# Patient Record
Sex: Female | Born: 2000 | Race: Black or African American | Hispanic: No | Marital: Single | Smoking: Current every day smoker
Health system: Southern US, Community
[De-identification: ages and names within clinical notes are randomized; demographics above are authoritative.]

## PROBLEM LIST (undated history)

## (undated) DIAGNOSIS — F319 Bipolar disorder, unspecified: Secondary | ICD-10-CM

## (undated) DIAGNOSIS — E119 Type 2 diabetes mellitus without complications: Secondary | ICD-10-CM

## (undated) DIAGNOSIS — R011 Cardiac murmur, unspecified: Secondary | ICD-10-CM

## (undated) HISTORY — PX: CATARACT EXTRACTION W/ INTRAOCULAR LENS IMPLANT: SHX1309

## (undated) HISTORY — PX: CARDIAC SURGERY: SHX584

---
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
## 2020-02-26 ENCOUNTER — Encounter (HOSPITAL_COMMUNITY): Payer: Self-pay | Admitting: Emergency Medicine

## 2020-02-26 ENCOUNTER — Other Ambulatory Visit: Payer: Self-pay

## 2020-02-26 ENCOUNTER — Emergency Department (HOSPITAL_COMMUNITY)
Admission: EM | Admit: 2020-02-26 | Discharge: 2020-02-26 | Disposition: A | Discharge status: Home / Self Care | Payer: Medicaid Other | Attending: Emergency Medicine | Admitting: Emergency Medicine

## 2020-02-26 DIAGNOSIS — S0992XA Unspecified injury of nose, initial encounter: Secondary | ICD-10-CM | POA: Diagnosis present

## 2020-02-26 DIAGNOSIS — S0511XA Contusion of eyeball and orbital tissues, right eye, initial encounter: Secondary | ICD-10-CM | POA: Diagnosis not present

## 2020-02-26 DIAGNOSIS — S0512XA Contusion of eyeball and orbital tissues, left eye, initial encounter: Secondary | ICD-10-CM | POA: Insufficient documentation

## 2020-02-26 DIAGNOSIS — Z794 Long term (current) use of insulin: Secondary | ICD-10-CM | POA: Insufficient documentation

## 2020-02-26 DIAGNOSIS — E119 Type 2 diabetes mellitus without complications: Secondary | ICD-10-CM | POA: Diagnosis not present

## 2020-02-26 DIAGNOSIS — W208XXA Other cause of strike by thrown, projected or falling object, initial encounter: Secondary | ICD-10-CM | POA: Diagnosis not present
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
## 2020-02-26 NOTE — Discharge Instructions (Addendum)
As we discussed you likely have a fractured nasal bone. Without imaging however we cannot completely confirm this.  It is reasonable that you declined imaging at this time.  What I recommend is following up with [facility]NT.  Please call Monday to make an appointment.  In the meantime use Tylenol and ibuprofen as discussed below.  Cool compresses can be helpful to decrease in swelling and elevating the head of bed and avoid forceful blowing of nose.  Please use Tylenol or ibuprofen for pain.  You may use 600 mg ibuprofen every 6 hours or 1000 mg of Tylenol every 6 hours.  You may choose to alternate between the 2.  This would be most effective.  Not to exceed 4 g of Tylenol within 24 hours.  Not to exceed 3200 mg ibuprofen 24 hours.
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
## 2020-11-11 ENCOUNTER — Emergency Department (HOSPITAL_COMMUNITY): Payer: Medicaid Other

## 2020-11-11 ENCOUNTER — Other Ambulatory Visit: Payer: Self-pay

## 2020-11-11 ENCOUNTER — Emergency Department (HOSPITAL_COMMUNITY)
Admission: EM | Admit: 2020-11-11 | Discharge: 2020-11-11 | Disposition: A | Discharge status: Home / Self Care | Payer: Medicaid Other | Attending: Emergency Medicine | Admitting: Emergency Medicine

## 2020-11-11 ENCOUNTER — Encounter (HOSPITAL_COMMUNITY): Payer: Self-pay

## 2020-11-11 DIAGNOSIS — R102 Pelvic and perineal pain: Secondary | ICD-10-CM | POA: Diagnosis not present

## 2020-11-11 DIAGNOSIS — E109 Type 1 diabetes mellitus without complications: Secondary | ICD-10-CM | POA: Insufficient documentation

## 2020-11-11 DIAGNOSIS — F17290 Nicotine dependence, other tobacco product, uncomplicated: Secondary | ICD-10-CM | POA: Diagnosis not present

## 2020-11-11 DIAGNOSIS — R1084 Generalized abdominal pain: Secondary | ICD-10-CM | POA: Diagnosis not present

## 2020-11-11 DIAGNOSIS — R112 Nausea with vomiting, unspecified: Secondary | ICD-10-CM | POA: Insufficient documentation

## 2020-11-11 DIAGNOSIS — D72829 Elevated white blood cell count, unspecified: Secondary | ICD-10-CM | POA: Insufficient documentation

## 2020-11-11 DIAGNOSIS — Z794 Long term (current) use of insulin: Secondary | ICD-10-CM | POA: Diagnosis not present

## 2020-11-11 LAB — CBC WITH DIFFERENTIAL/PLATELET
Abs Immature Granulocytes: 0.09 10*3/uL — ABNORMAL HIGH (ref 0.00–0.07)
Basophils Absolute: 0 10*3/uL (ref 0.0–0.1)
Basophils Relative: 0 %
Eosinophils Absolute: 0 10*3/uL (ref 0.0–0.5)
Eosinophils Relative: 0 %
HCT: 46.3 % — ABNORMAL HIGH (ref 36.0–46.0)
Hemoglobin: 14.9 g/dL (ref 12.0–15.0)
Immature Granulocytes: 1 %
Lymphocytes Relative: 14 %
Lymphs Abs: 2.1 10*3/uL (ref 0.7–4.0)
MCH: 28.4 pg (ref 26.0–34.0)
MCHC: 32.2 g/dL (ref 30.0–36.0)
MCV: 88.2 fL (ref 80.0–100.0)
Monocytes Absolute: 0.5 10*3/uL (ref 0.1–1.0)
Monocytes Relative: 4 %
Neutro Abs: 11.8 10*3/uL — ABNORMAL HIGH (ref 1.7–7.7)
Neutrophils Relative %: 81 %
Platelets: 255 10*3/uL (ref 150–400)
RBC: 5.25 MIL/uL — ABNORMAL HIGH (ref 3.87–5.11)
RDW: 14 % (ref 11.5–15.5)
WBC: 14.5 10*3/uL — ABNORMAL HIGH (ref 4.0–10.5)
nRBC: 0 % (ref 0.0–0.2)

## 2020-11-11 LAB — COMPREHENSIVE METABOLIC PANEL
ALT: 16 U/L (ref 0–44)
AST: 15 U/L (ref 15–41)
Albumin: 4.3 g/dL (ref 3.5–5.0)
Alkaline Phosphatase: 66 U/L (ref 38–126)
Anion gap: 11 (ref 5–15)
BUN: 11 mg/dL (ref 6–20)
CO2: 25 mmol/L (ref 22–32)
Calcium: 9.5 mg/dL (ref 8.9–10.3)
Chloride: 103 mmol/L (ref 98–111)
Creatinine, Ser: 0.59 mg/dL (ref 0.44–1.00)
GFR, Estimated: >60 mL/min (ref >60)
Glucose, Bld: 235 mg/dL — ABNORMAL HIGH (ref 70–99)
Potassium: 3.6 mmol/L (ref 3.5–5.1)
Sodium: 139 mmol/L (ref 135–145)
Total Bilirubin: 0.8 mg/dL (ref 0.3–1.2)
Total Protein: 7.5 g/dL (ref 6.5–8.1)

## 2020-11-11 LAB — WET PREP, GENITAL
Clue Cells Wet Prep HPF POC: NONE SEEN
Sperm: NONE SEEN
Trich, Wet Prep: NONE SEEN
WBC, Wet Prep HPF POC: <10 — AB
Yeast Wet Prep HPF POC: NONE SEEN

## 2020-11-11 LAB — LIPASE, BLOOD: Lipase: 28 U/L (ref 11–51)

## 2020-11-11 LAB — URINALYSIS, ROUTINE W REFLEX MICROSCOPIC
Bacteria, UA: NONE SEEN
Bilirubin Urine: NEGATIVE
Glucose, UA: 150 mg/dL — AB
Ketones, ur: 80 mg/dL — AB
Nitrite: NEGATIVE
Protein, ur: 100 mg/dL — AB
RBC / HPF: >50 RBC/hpf — ABNORMAL HIGH (ref 0–5)
Specific Gravity, Urine: 1.043 — ABNORMAL HIGH (ref 1.005–1.030)
pH: 6 (ref 5.0–8.0)

## 2020-11-11 LAB — I-STAT BETA HCG BLOOD, ED (MC, WL, AP ONLY): I-stat hCG, quantitative: <5 m[IU]/mL (ref <5)

## 2020-11-11 LAB — CBG MONITORING, ED: Glucose-Capillary: 232 mg/dL — ABNORMAL HIGH (ref 70–99)

## 2020-11-11 MED ORDER — IBUPROFEN 800 MG PO TABS
800.0000 mg | ORAL_TABLET | Freq: Once | ORAL | Status: AC
  Administered 2020-11-11: 800 mg via ORAL
  Filled 2020-11-11: qty 1

## 2020-11-11 MED ORDER — ONDANSETRON 4 MG PO TBDP: 4.0000 mg | ORAL_TABLET | Freq: Three times a day (TID) | ORAL | 0 refills | Status: AC | PRN

## 2020-11-11 MED ORDER — SODIUM CHLORIDE 0.9 % IV BOLUS
1000.0000 mL | Freq: Once | INTRAVENOUS | Status: AC
  Administered 2020-11-11: 1000 mL via INTRAVENOUS

## 2020-11-11 MED ORDER — MORPHINE SULFATE (PF) 4 MG/ML IV SOLN
4.0000 mg | Freq: Once | INTRAVENOUS | Status: AC
Start: 2020-11-11 — End: 2020-11-11
  Administered 2020-11-11: 4 mg via INTRAVENOUS
  Filled 2020-11-11: qty 1

## 2020-11-11 MED ORDER — ONDANSETRON HCL 4 MG/2ML IJ SOLN
4.0000 mg | Freq: Once | INTRAMUSCULAR | Status: AC
  Administered 2020-11-11: 4 mg via INTRAVENOUS
  Filled 2020-11-11: qty 2

## 2020-11-11 MED ORDER — IOHEXOL 300 MG/ML  SOLN
100.0000 mL | Freq: Once | INTRAMUSCULAR | Status: AC | PRN
  Administered 2020-11-11: 100 mL via INTRAVENOUS

## 2020-11-11 NOTE — Discharge Instructions (Addendum)
You were seen in the emergency department today for abdominal pain, nausea and vomiting.  While you were here we did an extensive work-up which was reassuring that there is nothing emergent going on in your abdomen.  I am going to send you home with nausea medication to take as needed over the next few days.  I would like you to follow-up with your primary care provider in 3 days if you are continuing to have any symptoms.  Please return to the emergency department if you have significantly increased abdominal pain with nausea and vomiting despite using your medicines and are unable to take any food or water down, or begin to have fevers not responsive to Tylenol.

## 2020-11-11 NOTE — ED Triage Notes (Signed)
Pt presents to ED with complaints of emesis since last night at 1900. Pt is type 1 diabetic. Pt takes Tresiba 28 units in am and Novolog carb SS, missed dose of Tresiba this am. CBG with EMS 287.

## 2020-11-11 NOTE — ED Notes (Signed)
Pelvic supplies at bedside. 

## 2020-11-11 NOTE — ED Notes (Signed)
Assisted PA with pelvic exam. Pt tolerated well.

## 2020-11-11 NOTE — ED Notes (Signed)
Pt sleeping on side causing bp to be soft. Normotensive on back. Reports going to restroom and noted approx 3 small clots come out of her vagina and states they were the size of a fingernail. PA updated and aware. Pt also reports 5/10 pain in in lower central abdomen. New orders obtained for pain controll.

## 2020-11-11 NOTE — ED Notes (Signed)
Pt states that zofran has helped her nausea, but still mildly present. Morphine given for 10/10 abdominal pain

## 2020-11-11 NOTE — ED Provider Notes (Signed)
[facility] EMERGENCY DEPARTMENT Provider Note   CSN: [id number] Arrival date & time: 11/11/20  1024     History Chief Complaint  Patient presents with   Emesis    Bailey Robinson is a 20 y.o. female.  With past medical history of type 1 diabetes who presents to the emergency department with nausea vomiting and abdominal pain.  She describes 5/10 intermittent, cramping lower abdominal pain that began suddenly yesterday and has progressed to today.  She states she took a nap yesterday and woke up around 5 PM and noticed that she had vaginal bleeding.  Describes it as spotting, but requiring a pad.  She states her last menstrual period was 10/1-10/5.  Denies other vaginal discharge or urinary symptoms.  She also states since yesterday evening she has had nausea and vomiting.  States that abdominal pain improves after vomiting and then gradually recurs.  She states she has vomited a total of around 6 times.  She states that she has not been able to keep any food or water down since yesterday.  She has not taken her insulin this morning.  She uses [place] 16 units, and NovoLog based on carb intake.  Denies fevers, cough, diarrhea, chest pain, shortness of breath.   Emesis Associated symptoms: abdominal pain   Associated symptoms: no fever       Past Medical History:  Diagnosis Date   Bipolar 1 disorder (HCC)    Diabetes mellitus without complication (HCC)    Heart murmur     There are no problems to display for this patient.   Past Surgical History:  Procedure Laterality Date   CARDIAC SURGERY     CATARACT EXTRACTION W/ INTRAOCULAR LENS IMPLANT Left      OB History   No obstetric history on file.     No family history on file.  Social History   Tobacco Use   Smoking status: Every Day    Types: Cigars   Smokeless tobacco: Never  Substance Use Topics   Alcohol use: Yes    Comment: occ   Drug use: Not Currently    Home Medications Prior to Admission  medications   Medication Sig Start Date End Date Taking? Authorizing Provider  glucagon (GLUCAGON EMERGENCY) 1 MG injection Inject 1 mg into the vein once as needed (severe hypoglycemia).    [provider]  insulin aspart (NOVOLOG) 100 UNIT/ML injection Inject 70 Units into the skin See admin instructions. Inject 10u per 8gms at breakfast, 1u per 15gms at lunch, 1u per 10gms at dinner and 1u per 15gms with snacks - max 70u daily    [provider]  insulin degludec (TRESIBA FLEXTOUCH) 100 UNIT/ML SOPN FlexTouch Pen Inject 16 Units into the skin daily.     [provider]    Allergies    Patient has no known allergies.  Review of Systems   Review of Systems  Constitutional:  Negative for fever.  Respiratory:  Negative for shortness of breath.   Cardiovascular:  Negative for chest pain.  Gastrointestinal:  Positive for abdominal pain, nausea and vomiting.  Genitourinary:  Positive for pelvic pain and vaginal bleeding. Negative for dysuria.  All other systems reviewed and are negative.  Physical Exam Updated Vital Signs Ht  (1.499 m)   Wt 61.2 kg   LMP 10/14/2020   BMI 27.27 kg/m   Physical Exam Vitals and nursing note reviewed. Exam conducted with a chaperone present.  Constitutional:      General:  She is not in acute distress.    Appearance: Normal appearance. She is not toxic-appearing.  HENT:     Head: Normocephalic and atraumatic.     Nose: Nose normal.     Mouth/Throat:     Mouth: Mucous membranes are moist.     Pharynx: Oropharynx is clear.  Eyes:     General: No scleral icterus.    Pupils: Pupils are equal, round, and reactive to light.  Cardiovascular:     Rate and Rhythm: Normal rate and regular rhythm.     Pulses: Normal pulses.  Pulmonary:     Effort: Pulmonary effort is normal. No respiratory distress.     Breath sounds: Normal breath sounds.  Abdominal:     General: Abdomen is flat. Bowel sounds are normal. There is no  distension.     Palpations: Abdomen is soft.     Tenderness: There is abdominal tenderness in the epigastric area and suprapubic area. There is no right CVA tenderness, left CVA tenderness, guarding or rebound. Negative signs include Murphy's sign and McBurney's sign.  Genitourinary:    General: Normal vulva.     Exam position: Lithotomy position.     Vagina: No signs of injury. Bleeding present. No vaginal discharge or lesions.     Cervix: Cervical motion tenderness and cervical bleeding present. No discharge, lesion or erythema.     Adnexa:        Right: No tenderness.         Left: No tenderness.    Musculoskeletal:        General: Normal range of motion.     Cervical back: Normal range of motion.  Skin:    General: Skin is warm and dry.     Capillary Refill: Capillary refill takes less than 2 seconds.  Neurological:     General: No focal deficit present.     Mental Status: She is alert and oriented to person, place, and time. Mental status is at baseline.  Psychiatric:        Mood and Affect: Mood normal.        Behavior: Behavior normal.    ED Results / Procedures / Treatments   Labs (all labs ordered are listed, but only abnormal results are displayed) Labs Reviewed  WET PREP, GENITAL - Abnormal; Notable for the following components:      Result Value   WBC, Wet Prep HPF POC <10 (*)    All other components within normal limits  COMPREHENSIVE METABOLIC PANEL - Abnormal; Notable for the following components:   Glucose, Bld 235 (*)    All other components within normal limits  CBC WITH DIFFERENTIAL/PLATELET - Abnormal; Notable for the following components:   WBC 14.5 (*)    RBC 5.25 (*)    HCT 46.3 (*)    Neutro Abs 11.8 (*)    Abs Immature Granulocytes 0.09 (*)    All other components within normal limits  URINALYSIS, ROUTINE W REFLEX MICROSCOPIC - Abnormal; Notable for the following components:   Color, Urine RED (*)    APPearance CLOUDY (*)    Specific Gravity,  Urine 1.043 (*)    Glucose, UA 150 (*)    Hgb urine dipstick LARGE (*)    Ketones, ur 80 (*)    Protein, ur 100 (*)    Leukocytes,Ua TRACE (*)    RBC / HPF >50 (*)    All other components within normal limits  CBG MONITORING, ED - Abnormal; Notable for the following  components:   Glucose-Capillary 232 (*)    All other components within normal limits  URINE CULTURE  LIPASE, BLOOD  I-STAT BETA HCG BLOOD, ED ([facility], [facility], [facility] ONLY)  GC/CHLAMYDIA PROBE AMP ([place]) NOT AT [facility]   EKG None  Radiology US Transvaginal Non-OB  Result Date: 11/11/2020 CLINICAL DATA:  Sudden onset pelvic pain and bleeding EXAM: TRANSABDOMINAL AND TRANSVAGINAL ULTRASOUND OF PELVIS DOPPLER ULTRASOUND OF OVARIES TECHNIQUE: Both transabdominal and transvaginal ultrasound examinations of the pelvis were performed. Transabdominal technique was performed for global imaging of the pelvis including uterus, ovaries, adnexal regions, and pelvic cul-de-sac. It was necessary to proceed with endovaginal exam following the transabdominal exam to visualize the endometrium. Color and duplex Doppler ultrasound was utilized to evaluate blood flow to the ovaries. COMPARISON:  None. FINDINGS: Uterus Measurements: 7.1 x 3.3 x 4.3 cm = volume: 53 mL. No fibroids or other mass visualized. Endometrium Thickness: 3.  No focal abnormality visualized. Right ovary Measurements: 4.3 x 2.0 x 2.5 cm = volume: 11 mL. Normal appearance/no adnexal mass. Left ovary Measurements: 3.9 x 2.4 x 2.7 cm = volume: 13 mL. Normal appearance/no adnexal mass. Pulsed Doppler evaluation of both ovaries demonstrates normal low-resistance arterial and venous waveforms. Other findings Diffuse mild thickening of the bladder wall. IMPRESSION: 1. No evidence of ovarian torsion at this time. 2. Normal sonographic appearance of the uterus and ovaries. 3. Diffuse mild thickening of the bladder wall may reflect underlying cystitis of either infectious or inflammatory etiology.  Recommend correlation with urinalysis. Electronically Signed   By: Malachy Moan M.D.   On: 11/11/2020 13:23   US Pelvis Complete  Result Date: 11/11/2020 CLINICAL DATA:  Sudden onset pelvic pain and bleeding EXAM: TRANSABDOMINAL AND TRANSVAGINAL ULTRASOUND OF PELVIS DOPPLER ULTRASOUND OF OVARIES TECHNIQUE: Both transabdominal and transvaginal ultrasound examinations of the pelvis were performed. Transabdominal technique was performed for global imaging of the pelvis including uterus, ovaries, adnexal regions, and pelvic cul-de-sac. It was necessary to proceed with endovaginal exam following the transabdominal exam to visualize the endometrium. Color and duplex Doppler ultrasound was utilized to evaluate blood flow to the ovaries. COMPARISON:  None. FINDINGS: Uterus Measurements: 7.1 x 3.3 x 4.3 cm = volume: 53 mL. No fibroids or other mass visualized. Endometrium Thickness: 3.  No focal abnormality visualized. Right ovary Measurements: 4.3 x 2.0 x 2.5 cm = volume: 11 mL. Normal appearance/no adnexal mass. Left ovary Measurements: 3.9 x 2.4 x 2.7 cm = volume: 13 mL. Normal appearance/no adnexal mass. Pulsed Doppler evaluation of both ovaries demonstrates normal low-resistance arterial and venous waveforms. Other findings Diffuse mild thickening of the bladder wall. IMPRESSION: 1. No evidence of ovarian torsion at this time. 2. Normal sonographic appearance of the uterus and ovaries. 3. Diffuse mild thickening of the bladder wall may reflect underlying cystitis of either infectious or inflammatory etiology. Recommend correlation with urinalysis. Electronically Signed   By: Malachy Moan M.D.   On: 11/11/2020 13:23   CT ABDOMEN PELVIS W CONTRAST  Result Date: 11/11/2020 CLINICAL DATA:  Abdominal pain EXAM: CT ABDOMEN AND PELVIS WITH CONTRAST TECHNIQUE: Multidetector CT imaging of the abdomen and pelvis was performed using the standard protocol following bolus administration of intravenous contrast.  CONTRAST:  OMNIPAQUE IOHEXOL 300 MG/ML  SOLN COMPARISON:  None. FINDINGS: Lower chest: Unremarkable Hepatobiliary: Liver measures 18.3 cm in length. No focal abnormality is seen. There is no dilation of bile ducts. Gallbladder is unremarkable. Pancreas: There is slight prominence of pancreatic duct. No focal abnormality is  seen. Spleen: Unremarkable Adrenals/Urinary Tract: Adrenals are not enlarged. There is no hydronephrosis. There are no renal or ureteral stones. Urinary bladder is not distended. There is mild diffuse wall thickening in the bladder. Stomach/Bowel: Stomach is not distended. Small bowel loops are not dilated. Appendix is not dilated. There is no significant wall thickening in colon. There is no pericolic stranding or fluid collection. Vascular/Lymphatic: No significant abnormality is seen Reproductive: Unremarkable Other: Small umbilical hernia containing fat is seen Musculoskeletal: Unremarkable IMPRESSION: There is no evidence of intestinal obstruction or pneumoperitoneum. There is no hydronephrosis. Appendix is not dilated. Enlarged liver. Electronically Signed   By: Ernie Avena M.D.   On: 11/11/2020 16:34   [place] Art/Ven Flow Abd Pelv Doppler  Result Date: 11/11/2020 CLINICAL DATA:  Sudden onset pelvic pain and bleeding EXAM: TRANSABDOMINAL AND TRANSVAGINAL ULTRASOUND OF PELVIS DOPPLER ULTRASOUND OF OVARIES TECHNIQUE: Both transabdominal and transvaginal ultrasound examinations of the pelvis were performed. Transabdominal technique was performed for global imaging of the pelvis including uterus, ovaries, adnexal regions, and pelvic cul-de-sac. It was necessary to proceed with endovaginal exam following the transabdominal exam to visualize the endometrium. Color and duplex Doppler ultrasound was utilized to evaluate blood flow to the ovaries. COMPARISON:  None. FINDINGS: Uterus Measurements: 7.1 x 3.3 x 4.3 cm = volume: 53 mL. No fibroids or other mass visualized. Endometrium  Thickness: 3.  No focal abnormality visualized. Right ovary Measurements: 4.3 x 2.0 x 2.5 cm = volume: 11 mL. Normal appearance/no adnexal mass. Left ovary Measurements: 3.9 x 2.4 x 2.7 cm = volume: 13 mL. Normal appearance/no adnexal mass. Pulsed Doppler evaluation of both ovaries demonstrates normal low-resistance arterial and venous waveforms. Other findings Diffuse mild thickening of the bladder wall. IMPRESSION: 1. No evidence of ovarian torsion at this time. 2. Normal sonographic appearance of the uterus and ovaries. 3. Diffuse mild thickening of the bladder wall may reflect underlying cystitis of either infectious or inflammatory etiology. Recommend correlation with urinalysis. Electronically Signed   By: Malachy Moan M.D.   On: 11/11/2020 13:23    Procedures Pelvic exam  Date/Time: 11/11/2020 12:06 PM Performed by: Cristopher Peru, PA-C Authorized by: Cristopher Peru, PA-C  Consent: Verbal consent obtained. Risks and benefits: risks, benefits and alternatives were discussed Consent given by: patient Patient understanding: patient states understanding of the procedure being performed Patient consent: the patient's understanding of the procedure matches consent given Procedure consent: procedure consent matches procedure scheduled Relevant documents: relevant documents present and verified Test results: test results available and properly labeled Site marked: the operative site was marked Imaging studies: imaging studies available Patient identity confirmed: verbally with patient Local anesthesia used: no  Anesthesia: Local anesthesia used: no  Sedation: Patient sedated: no  Patient tolerance: patient tolerated the procedure well with no immediate complications    Medications Ordered in ED Medications  sodium chloride 0.9 % bolus 1,000 mL (0 mLs Intravenous Stopped 11/11/20 1208)  ondansetron (ZOFRAN) injection 4 mg (4 mg Intravenous Given 11/11/20 1052)  morphine 4 MG/ML  injection 4 mg (4 mg Intravenous Given 11/11/20 1123)  ibuprofen (ADVIL) tablet 800 mg (800 mg Oral Given 11/11/20 1647)  iohexol (OMNIPAQUE) 300 MG/ML solution 100 mL (100 mLs Intravenous Contrast Given 11/11/20 1613)   ED Course  I have reviewed the triage vital signs and the nursing notes.  Pertinent labs & imaging results that were available during my care of the patient were reviewed by me and considered in my medical decision making (see chart for  details).    MDM Rules/Calculators/A&P 20 year old female who presents the emergency department with abdominal pain, nausea and vomiting.  Initial differential broad however initially concerned with DKA as the patient has inability to tolerate p.o. at home, nausea and vomiting in the setting of type 1 diabetes.  However lab work shows blood glucose of 235 without anion gap.  She has ketones in her urine however her presentation is not consistent with DKA.  Leukocytosis to 14.5 Given her vaginal bleeding and lower abdominal pain pelvic exam was performed.  This showed blood in the vaginal vault.  Cervical os was closed.  Ultrasound was performed which ruled out ovarian torsion, TOA, ectopic pregnancy.  It did incidentally note that she had bladder wall thickening. Not pregnant.  Wet prep without trichomonas or clue cells.  GC chlamydia collected and pending. UA likely contaminated with blood from menstrual cycle.  However it has only trace leukocytes which can be seen with blood, there are no bacteria present. Lipase negative and presentation not consistent with pancreatitis. Given no clear etiology of abdominal pain obtain CT abdomen pelvis with contrast which showed hepatomegaly with liver 18.3 cm.  However her presentation is not consistent with acute liver pathology.  She has no transaminitis on lab work, no gallstones present, no right upper quadrant pain.  Likely incidental finding.  It did show that she has some bladder wall thickening  without urinary stone, hydronephrosis, evidence of pyelonephritis.  Again there is no clinical suspicion for UTI and UA is without evidence of UTI. -CT without evidence of appendicitis. Overall her work-up here in the emergency department has been reassuring.  She has been given morphine, Zofran and ibuprofen with pain control.  Gave her 1 L IV fluids for resuscitation.  She is able to tolerate p.o. here in the emergency department.  I have instructed her to follow-up with her primary care provider in 3 days if she has any new symptoms of UTI given the bladder wall thickening.  However I do not think that she needs antibiotics at this time.  She is instructed to return to the emergency department if she has increasing abdominal pain, nausea and vomiting intractable to Zofran use or begins to have fever.  She understands instruction with teach back.  Her vital signs are stable and she is safe for discharge at this time. Final Clinical Impression(s) / ED Diagnoses Final diagnoses:  Generalized abdominal pain    Rx / DC Orders ED Discharge Orders          Ordered    ondansetron (ZOFRAN ODT) 4 MG disintegrating tablet  Every 8 hours PRN        11/11/20 1649             Cristopher Peru, PA-C 11/11/20 1657    Sloan Leiter, DO 11/13/20 1652

## 2020-11-13 LAB — URINE CULTURE: Culture: 80000 — AB

## 2020-11-13 LAB — GC/CHLAMYDIA PROBE AMP (~~LOC~~) NOT AT ARMC
Chlamydia: NEGATIVE
Comment: NEGATIVE
Comment: NORMAL
Neisseria Gonorrhea: NEGATIVE

## 2020-11-14 ENCOUNTER — Telehealth: Payer: Self-pay

## 2020-11-14 NOTE — Telephone Encounter (Signed)
Post ED Visit - Positive Culture Follow-up  Culture report reviewed by antimicrobial stewardship pharmacist: [facility] [address] [place].D. []  , Pharm.D., BCPS AQ-ID []  Celedonio Miyamoto, Pharm.D., BCPS []  , Pharm.D., BCPS []  [place], .D., BCPS, AAHIVP []  Georgina Pillion, Pharm.D., BCPS, AAHIVP []  , PharmD, BCPS []  [place], PharmD, BCPS []  [address], PharmD, BCPS []  , PharmD []  Estella Husk, PharmD, BCPS []  , PharmD  [facility] []  , PharmD []  Phillips Climes, PharmD []  , PharmD []  Agapito Games, Rph []  ) Verlan Friends, PharmD []  , PharmD []  Mervyn Gay, PharmD []  , PharmD []  Vinnie Level, PharmD []  [facility], PharmD []  , PharmD []  Len Childs, PharmD []  , PharmD   UA contaminated, no urinary symptoms, vaginal bleeding, no treatment necessary. no further patient follow-up is required at this time.  Greer Pickerel 11/14/2020, 11:28 AM

## 2021-03-24 ENCOUNTER — Other Ambulatory Visit: Payer: Self-pay

## 2021-03-24 ENCOUNTER — Emergency Department (HOSPITAL_COMMUNITY)
Admission: EM | Admit: 2021-03-24 | Discharge: 2021-03-24 | Disposition: A | Discharge status: Home / Self Care | Payer: Medicaid Other | Attending: Emergency Medicine | Admitting: Emergency Medicine

## 2021-03-24 ENCOUNTER — Encounter (HOSPITAL_COMMUNITY): Payer: Self-pay | Admitting: Emergency Medicine

## 2021-03-24 DIAGNOSIS — N764 Abscess of vulva: Secondary | ICD-10-CM | POA: Insufficient documentation

## 2021-03-24 DIAGNOSIS — R012 Other cardiac sounds: Secondary | ICD-10-CM | POA: Insufficient documentation

## 2021-03-24 MED ORDER — DOXYCYCLINE HYCLATE 100 MG PO TABS
100.0000 mg | ORAL_TABLET | Freq: Once | ORAL | Status: AC
  Administered 2021-03-24: 100 mg via ORAL
  Filled 2021-03-24: qty 1

## 2021-03-24 MED ORDER — OXYCODONE-ACETAMINOPHEN 5-325 MG PO TABS
1.0000 | ORAL_TABLET | Freq: Once | ORAL | Status: AC
  Administered 2021-03-24: 1 via ORAL
  Filled 2021-03-24: qty 1

## 2021-03-24 MED ORDER — DOXYCYCLINE HYCLATE 100 MG PO CAPS: 100.0000 mg | ORAL_CAPSULE | Freq: Two times a day (BID) | ORAL | 0 refills | Status: AC

## 2021-03-24 MED ORDER — LIDOCAINE-EPINEPHRINE 2 %-1:200000 IJ SOLN
20.0000 mL | Freq: Once | INTRAMUSCULAR | Status: AC
Start: 2021-03-24 — End: 2021-03-24
  Administered 2021-03-24: 20 mL
  Filled 2021-03-24: qty 20

## 2021-03-24 MED ORDER — IBUPROFEN 400 MG PO TABS
400.0000 mg | ORAL_TABLET | Freq: Once | ORAL | Status: AC
Start: 2021-03-24 — End: 2021-03-24
  Administered 2021-03-24: 400 mg via ORAL
  Filled 2021-03-24: qty 1

## 2021-03-24 NOTE — ED Triage Notes (Signed)
Patient c/o abscess to left labia that appeared yesterday and is progressively getting worse. Denies any fever or drainage. Per patient using fatback and warm compress with no relief. Patient does state nausea and vomiting x6 from pain.  ?

## 2021-03-24 NOTE — Discharge Instructions (Signed)
It was our pleasure to provide your ER care today - we hope that you feel better. ? ?Keep area very clean. Moist warm compresses to sore area to encourage drainage.  ? ?Take acetaminophen or ibuprofen as need for pain. Take doxycycline (antibiotic) as prescribed.  ? ?Follow up with primary care doctor this coming week if symptoms fail to improve/resolve. ? ?Return to ER if worse, new symptoms, fevers, severe pain, increased swelling or spreading redness, or other concern. ? ?You were given pain meds in the ER - no driving for the next 6 hours.  ?

## 2021-03-24 NOTE — ED Provider Notes (Signed)
?[facility] EMERGENCY DEPARTMENT ?Provider Note ? ? ?CSN: [id number] ?Arrival date & time: 03/24/21  0640 ? ?  ? ?History ? ?Chief Complaint  ?Patient presents with  ? Abscess  ? ? ?Bailey Robinson is a 21 y.o. female. ? ?Patient c/o left labial area abscess in the past few days. Symptoms acute onset, moderate, persistent. No fever or chills. No vaginal discharge or bleeding. Pt notes on prior abscess, and indicates went away without I and D. Denies hx mrsa.  ? ?The history is provided by the patient, medical records and the EMS personnel.  ?Abscess ?Associated symptoms: no fever   ? ?  ? ?Home Medications ?Prior to Admission medications   ?Medication Sig Start Date End Date Taking? Authorizing Provider  ?glucagon 1 MG injection Inject 1 mg into the vein once as needed (severe hypoglycemia).    [provider]  ?insulin aspart (NOVOLOG) 100 UNIT/ML injection Inject 70 Units into the skin See admin instructions. Inject 10u per 8gms at breakfast, 1u per 15gms at lunch, 1u per 10gms at dinner and 1u per 15gms with snacks - max 70u daily    [provider]  ?insulin degludec (TRESIBA) 100 UNIT/ML FlexTouch Pen Inject 16 Units into the skin daily.     [provider]  ?ondansetron (ZOFRAN ODT) 4 MG disintegrating tablet Take 1 tablet (4 mg total) by mouth every 8 (eight) hours as needed for nausea or vomiting. 11/11/20   Cristopher Peru, PA-C  ?   ? ?Allergies    ?Patient has no known allergies.   ? ?Review of Systems   ?Review of Systems  ?Constitutional:  Negative for chills and fever.  ?Gastrointestinal:  Negative for abdominal pain.  ?Genitourinary:  Negative for dysuria, vaginal bleeding and vaginal discharge.  ?Skin:  Negative for rash.  ? ?Physical Exam ?Updated Vital Signs ?BP 121/77 (BP Location: Right Arm)   Pulse 97   Temp 98.1 ?F (36.7 ?C) (Oral)   Resp 18   Ht 1.524 m (5')   Wt 64.9 kg   LMP 03/24/2021   SpO2 100%   BMI 27.93 kg/m?  ?Physical Exam ?Vitals and nursing  note reviewed.  ?Constitutional:   ?   Appearance: Normal appearance. She is well-developed.  ?HENT:  ?   Head: Atraumatic.  ?   Nose: Nose normal.  ?   Mouth/Throat:  ?   Mouth: Mucous membranes are moist.  ?Eyes:  ?   General: No scleral icterus. ?   Conjunctiva/sclera: Conjunctivae normal.  ?Neck:  ?   Trachea: No tracheal deviation.  ?Cardiovascular:  ?   Rate and Rhythm: Normal rate.  ?   Pulses: Normal pulses.  ?   Heart sounds:  ?  Friction rub present.  ?Pulmonary:  ?   Effort: Pulmonary effort is normal. No respiratory distress.  ?Abdominal:  ?   General: There is no distension.  ?   Palpations: Abdomen is soft.  ?   Tenderness: There is no abdominal tenderness.  ?Genitourinary: ?   Comments: No cva tenderness.  ?Musculoskeletal:     ?   General: No swelling.  ?   Cervical back: Neck supple. No muscular tenderness.  ?Skin: ?   General: Skin is warm and dry.  ?   Findings: No rash.  ?   Comments: 2 cm diameter left labia abscess. No crepitus.   ?Neurological:  ?   Mental Status: She is alert.  ?   Comments: Alert, speech normal.   ?Psychiatric:     ?  Mood and Affect: Mood normal.  ? ? ?ED Results / Procedures / Treatments   ?Labs ?(all labs ordered are listed, but only abnormal results are displayed) ?Labs Reviewed - No data to display ? ?EKG ?None ? ?Radiology ?No results found. ? ?Procedures ?Marland Kitchen.Incision and Drainage ? ?Date/Time: 03/24/2021 8:21 AM ?Performed by: Cathren Laine, MD ?Authorized by: Cathren Laine, MD  ? ?Consent:  ?  Consent given by:  Patient ?Location:  ?  Type:  Abscess ?  Size:  3 cm ?  Location:  Anogenital ?  Anogenital location:  Vulva ?Pre-procedure details:  ?  Skin preparation:  Chlorhexidine with alcohol ?Anesthesia:  ?  Anesthesia method:  Local infiltration ?  Local anesthetic:  Lidocaine 2% WITH epi ?Procedure type:  ?  Complexity:  Complex ?Procedure details:  ?  Incision types:  Single straight ?  Incision depth:  Subcutaneous ?  Wound management:  Probed and deloculated ?   Drainage:  Purulent ?  Drainage amount:  Moderate ?  Wound treatment:  Wound left open ?Post-procedure details:  ?  Procedure completion:  Procedure terminated at patient's request ?Comments:  ?   Multiple attempts to reassure patient, provide support and encouragement. Despite attempts by myself and RN to provide support, encouragement, etc. Pt poorly compliant w procedure from numbing to I and D.  Was able to make limited/quick incision and drain moderate amount of pus -  pt requests termination of procedure and further intervention, including prior to completion of normal amt probing/deloc/irrigation and packing/drain placement.  Moist warm compresses/sterile dressing applied.   ? ? ?Medications Ordered in ED ?Medications  ?lidocaine-EPINEPHrine (XYLOCAINE W/EPI) 2 %-1:200000 (PF) injection 20 mL (has no administration in time range)  ?oxyCODONE-acetaminophen (PERCOCET/ROXICET) 5-325 MG per tablet 1 tablet (has no administration in time range)  ? ? ?ED Course/ Medical Decision Making/ A&P ?  ?                        ?Medical Decision Making ?Risk ?Prescription drug management. ? ? ?Percocet po. ? ?Reviewed nursing notes and prior charts for additional history. External reports reviewed.  ? ?Discussed I and D w pt.  ? ?Sterile dressing.  ? ?Given patients request/termination before full/completion of procedure, will also give abx rx.  ? ?Return precautions provided.  ? ? ? ? ? ? ? ? ? ?Final Clinical Impression(s) / ED Diagnoses ?Final diagnoses:  ?None  ? ? ?Rx / DC Orders ?ED Discharge Orders   ? ? None  ? ?  ? ? ?  ?Cathren Laine, MD ?03/24/21 0827 ? ?

## 2022-02-04 IMAGING — CT CT ABD-PELV W/ CM
2 of 4 series · 17 of 46 positions shown, 19 images · IV contrast (Omnipaque or Isovue)
Comparison: None.

CLINICAL DATA: Abdominal pain

EXAM:
CT ABDOMEN AND PELVIS WITH CONTRAST
TECHNIQUE: Multidetector CT imaging of the abdomen and pelvis was performed
using the standard protocol following bolus administration of
intravenous contrast.
CONTRAST:  100mL OMNIPAQUE IOHEXOL 300 MG/ML  SOLN

[Series 2: axial st · axial · 0.89mm/px · z∈[+790,+1215]mm · 14 of 93 slices shown, 16 images]
[im 4/93  soft-tissue]
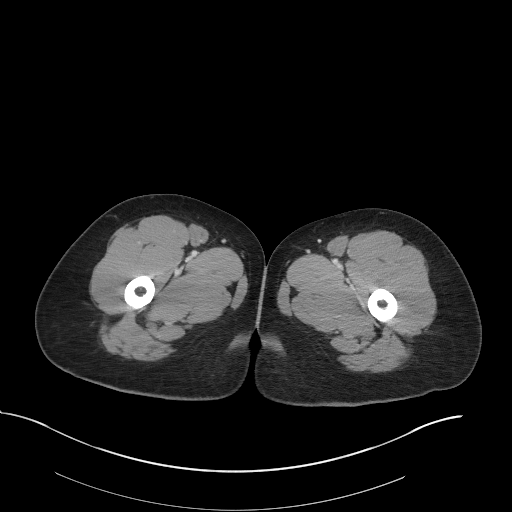
[im 4/93  bone]
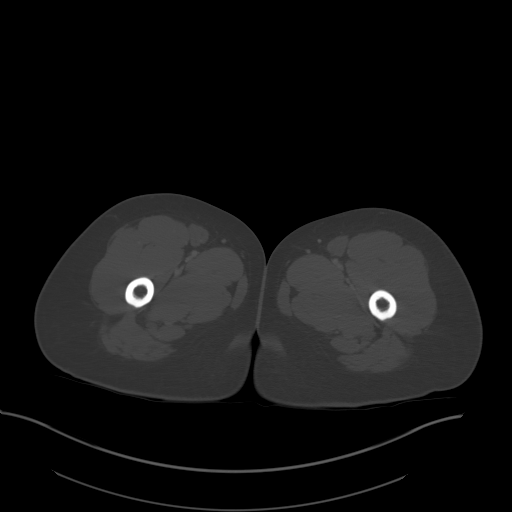
[im 12/93  soft-tissue]
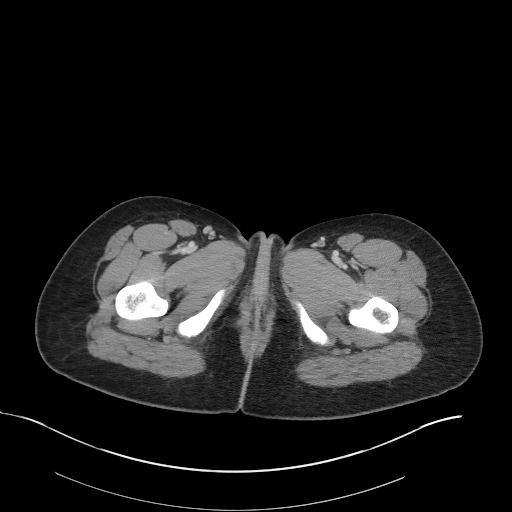
[im 20/93  soft-tissue]
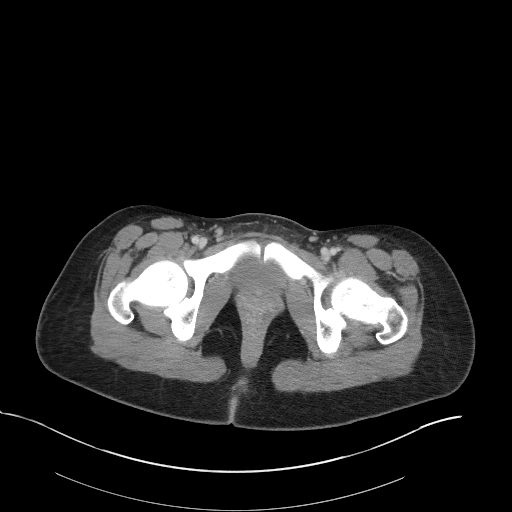
[im 24/93  soft-tissue]
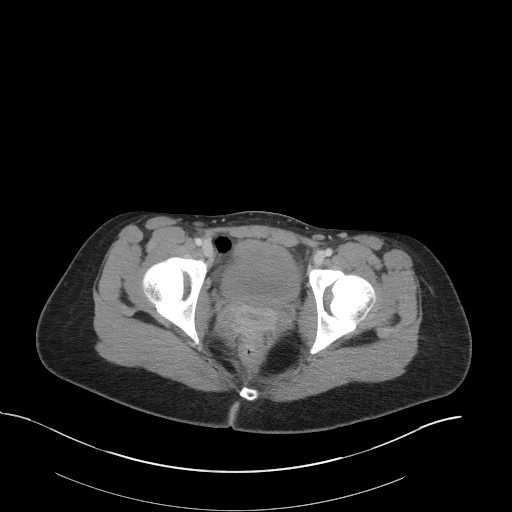
[im 31/93  soft-tissue]
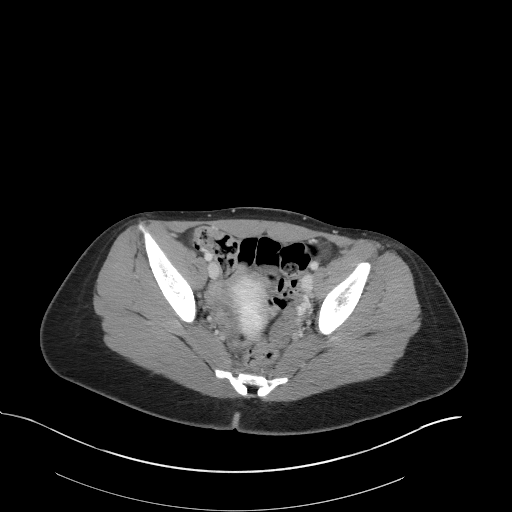
[im 39/93  soft-tissue]
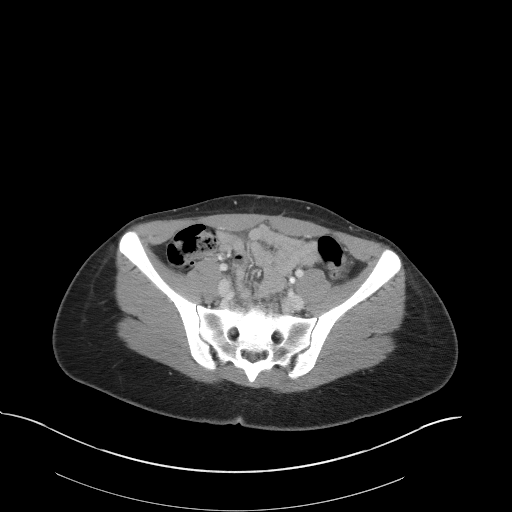
[im 43/93  soft-tissue]
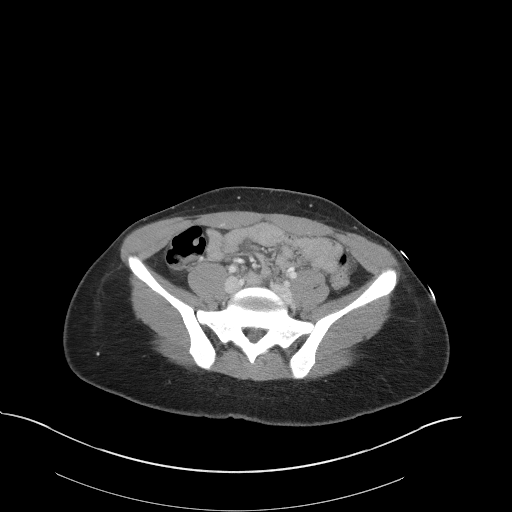
[im 50/93  soft-tissue]
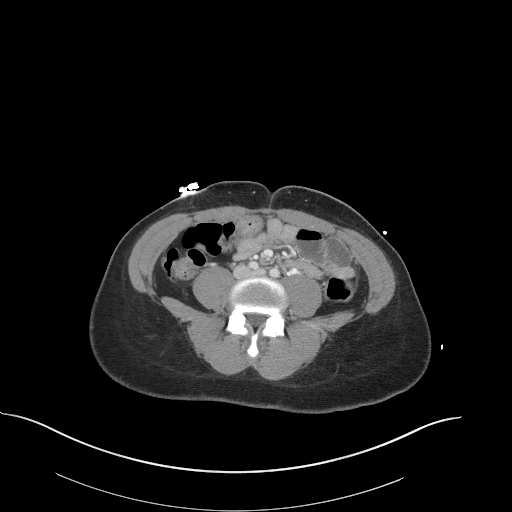
[im 54/93  soft-tissue]
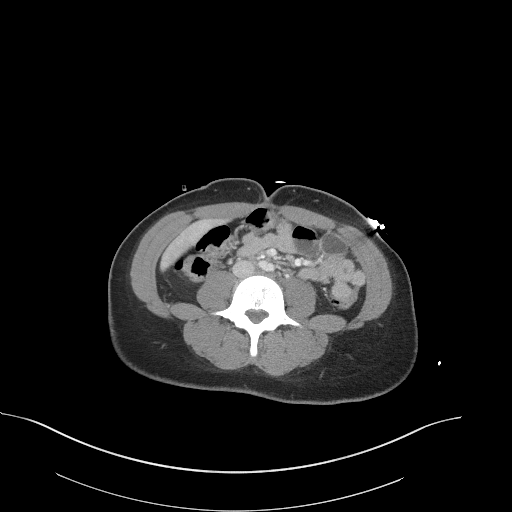
[im 54/93  bone]
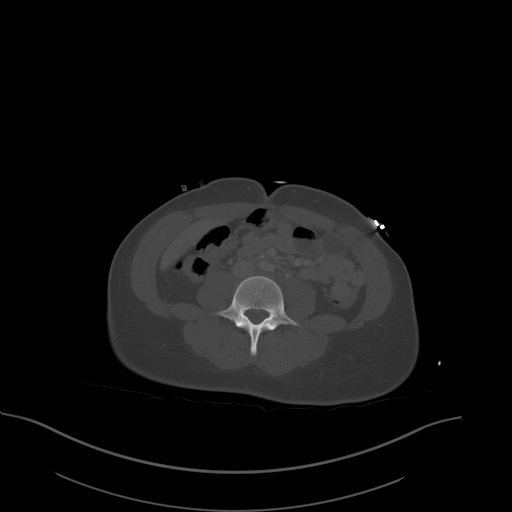
[im 62/93  soft-tissue]
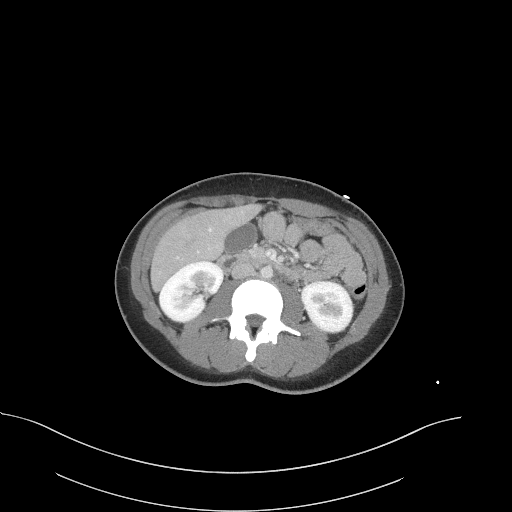
[im 70/93  soft-tissue]
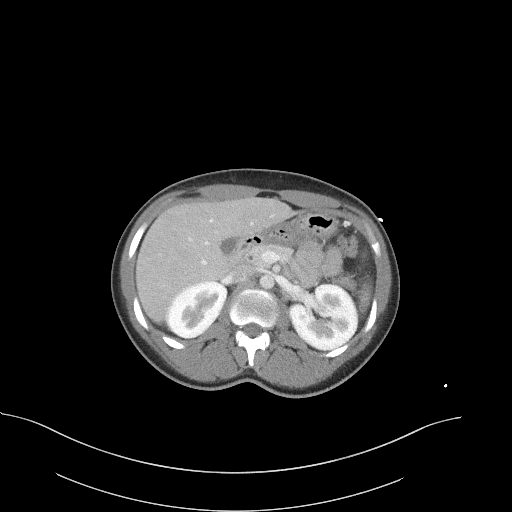
[im 73/93  soft-tissue]
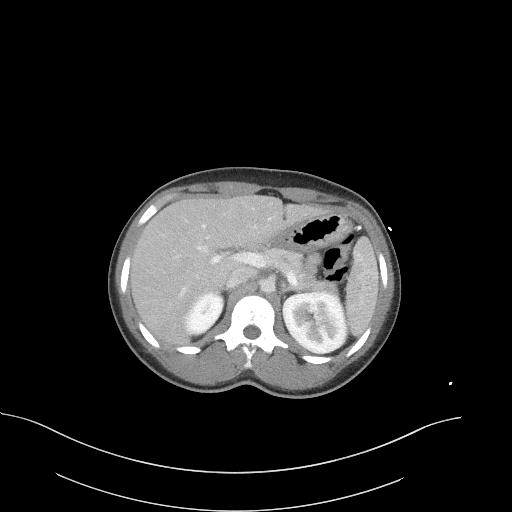
[im 81/93  soft-tissue]
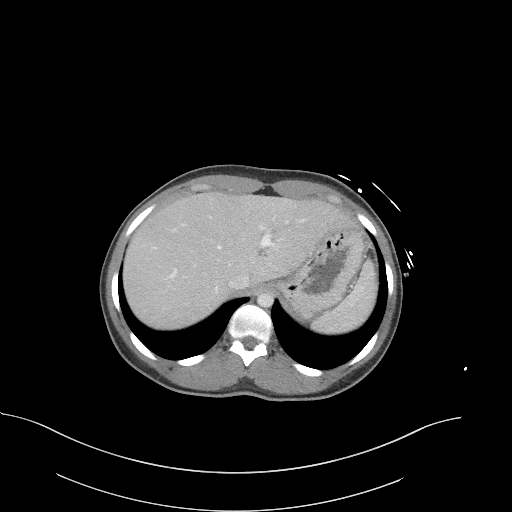
[im 89/93  soft-tissue]
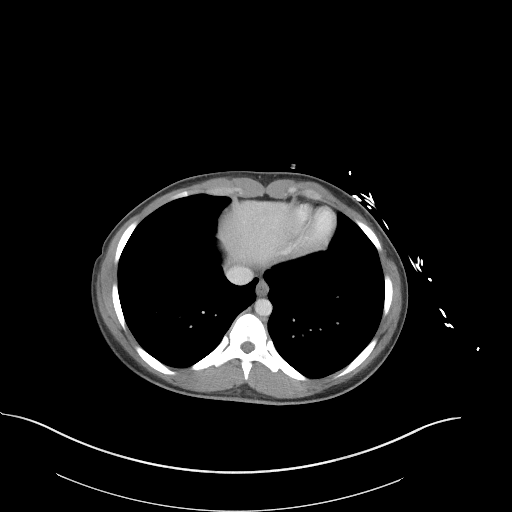

[Series 5: coronal st · coronal · 0.80mm/px · 3 of 90 slices shown]
[im 30/90  soft-tissue]
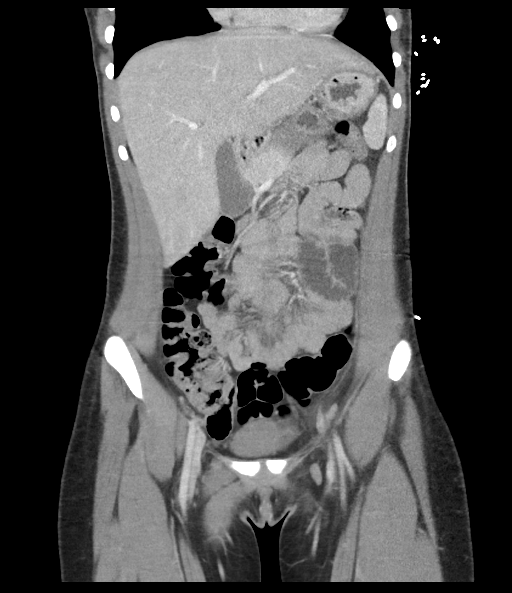
[im 40/90  soft-tissue]
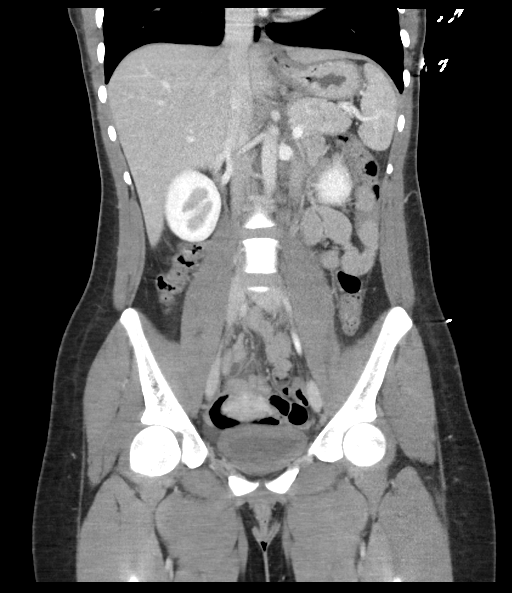
[im 50/90  soft-tissue]
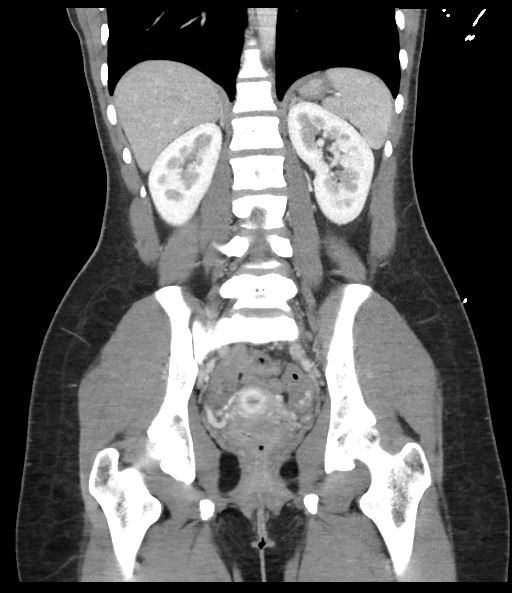

[17 of 46 positions shown; findings below may reference images not displayed]

FINDINGS: Lower chest: Unremarkable

Hepatobiliary: Liver measures 18.3 cm in length. No focal
abnormality is seen. There is no dilation of bile ducts. Gallbladder
is unremarkable.

Pancreas: There is slight prominence of pancreatic duct. No focal
abnormality is seen.

Spleen: Unremarkable

Adrenals/Urinary Tract: Adrenals are not enlarged. There is no
hydronephrosis. There are no renal or ureteral stones. Urinary
bladder is not distended. There is mild diffuse wall thickening in
the bladder.

Stomach/Bowel: Stomach is not distended. Small bowel loops are not
dilated. Appendix is not dilated. There is no significant wall
thickening in colon. There is no pericolic stranding or fluid
collection.

Vascular/Lymphatic: No significant abnormality is seen

Reproductive: Unremarkable

Other: Small umbilical hernia containing fat is seen

Musculoskeletal: Unremarkable
IMPRESSION: There is no evidence of intestinal obstruction or pneumoperitoneum.
There is no hydronephrosis. Appendix is not dilated.

Enlarged liver.

## 2022-02-04 IMAGING — US US PELVIS COMPLETE
1 series · 13 of 25 positions shown · non-contrast
Comparison: None.

CLINICAL DATA: Sudden onset pelvic pain and bleeding

EXAM:
TRANSABDOMINAL AND TRANSVAGINAL ULTRASOUND OF PELVIS
DOPPLER ULTRASOUND OF OVARIES
TECHNIQUE: Both transabdominal and transvaginal ultrasound examinations of the
pelvis were performed. Transabdominal technique was performed for
global imaging of the pelvis including uterus, ovaries, adnexal
regions, and pelvic cul-de-sac.
It was necessary to proceed with endovaginal exam following the
transabdominal exam to visualize the endometrium. Color and duplex
Doppler ultrasound was utilized to evaluate blood flow to the
ovaries.

[Series 1: us pelvis (transabdominal only) · 13 of 93 slices shown]
[im 1/93]
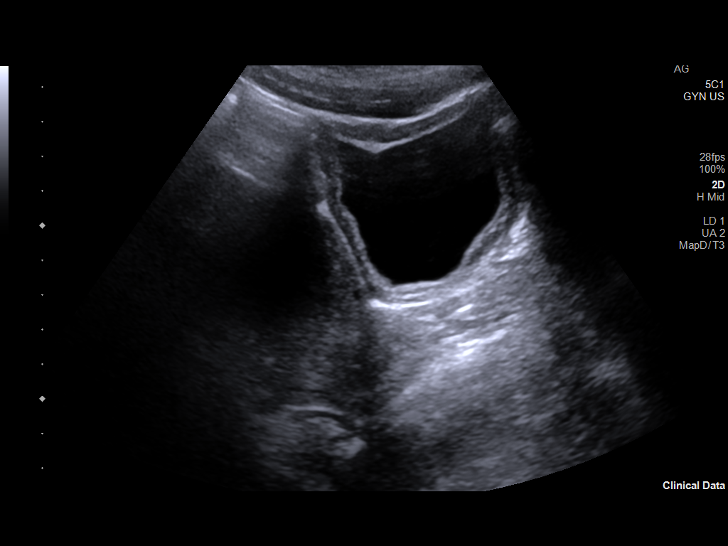
[im 8/93]
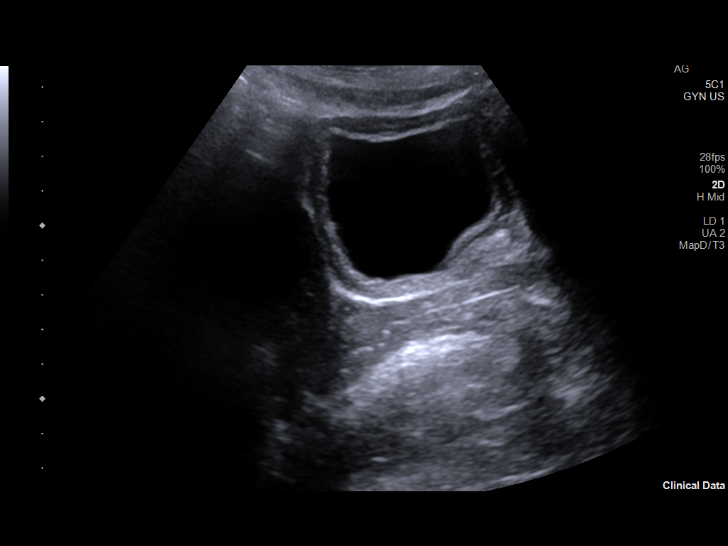
[im 16/93]
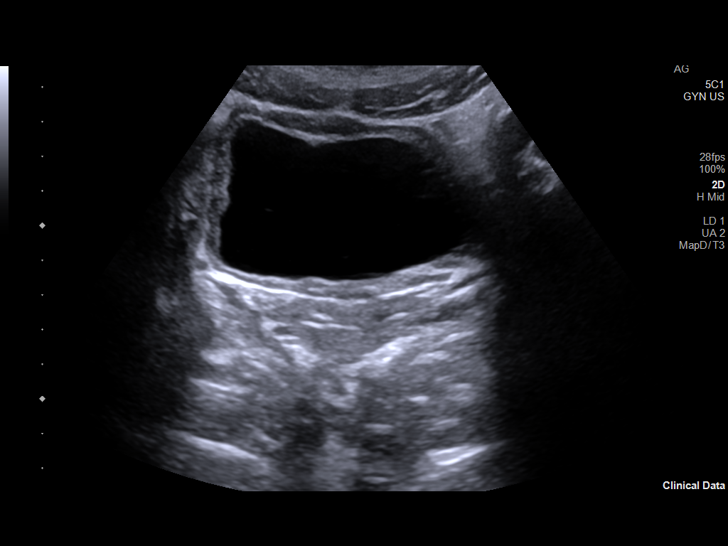
[im 24/93]
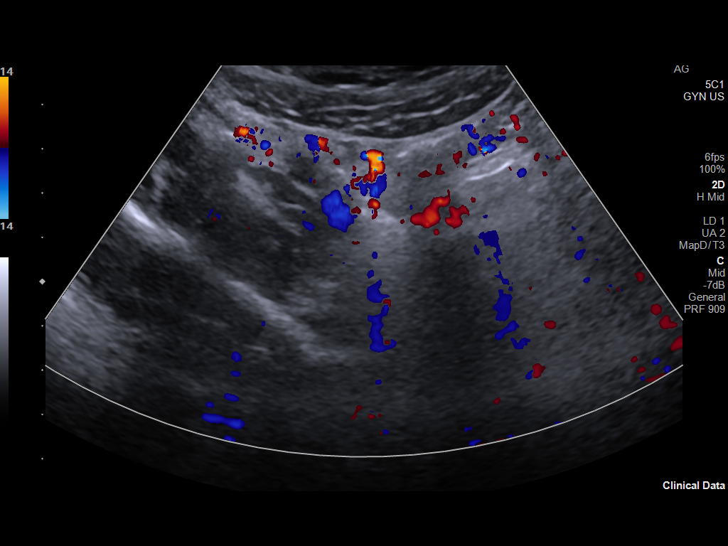
[im 31/93]
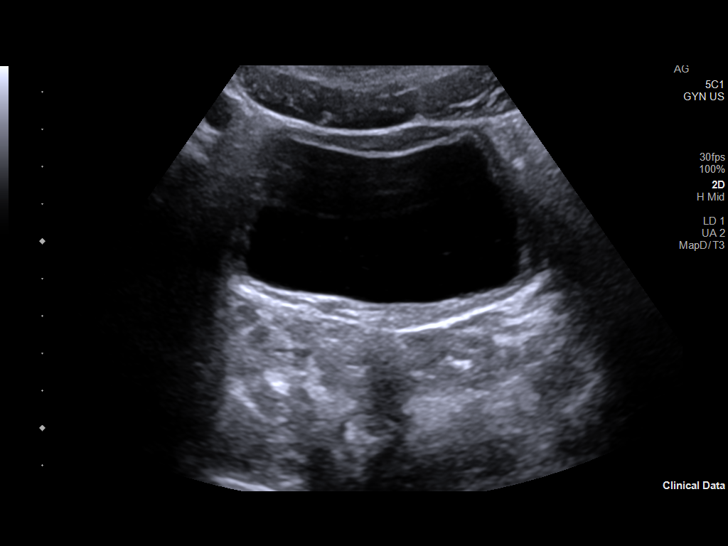
[im 39/93]
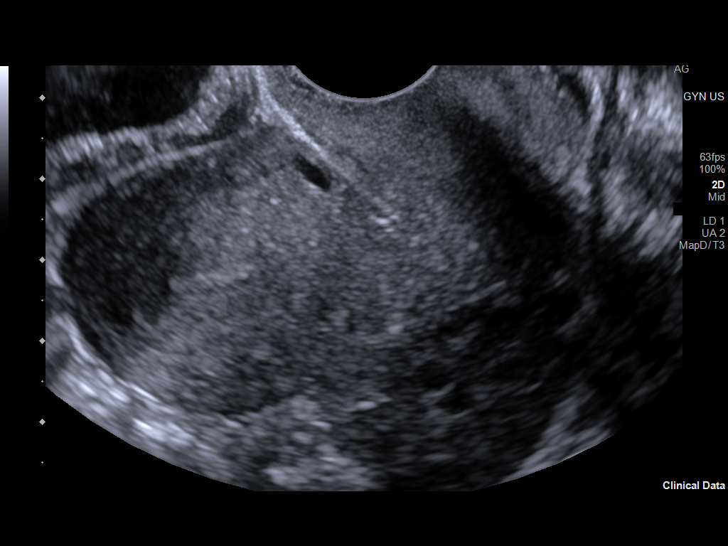
[im 47/93]
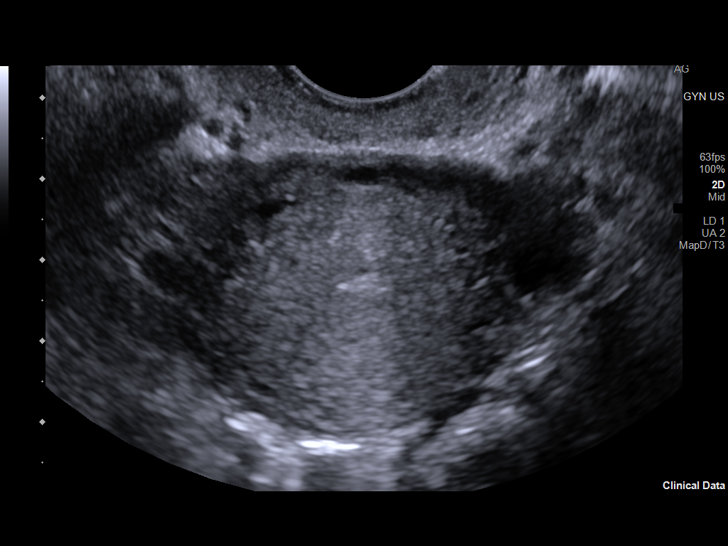
[im 54/93]
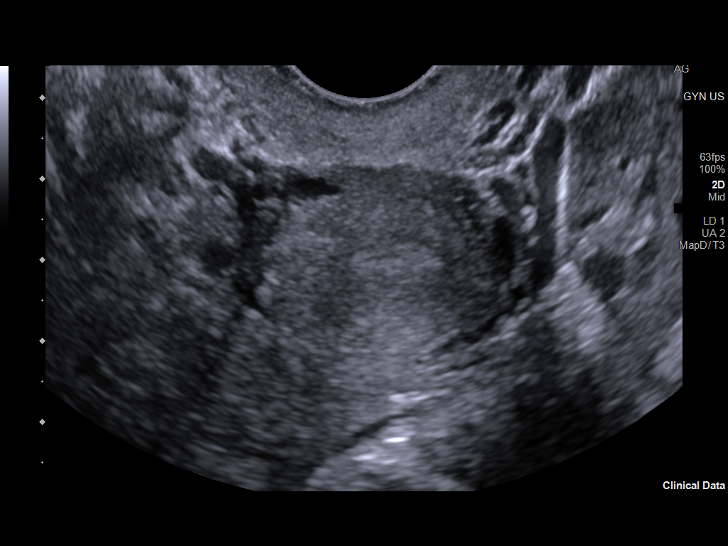
[im 62/93]
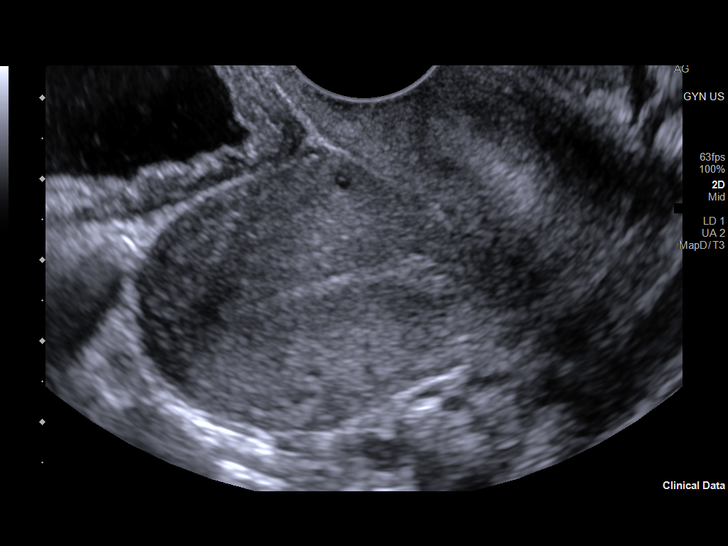
[im 70/93]
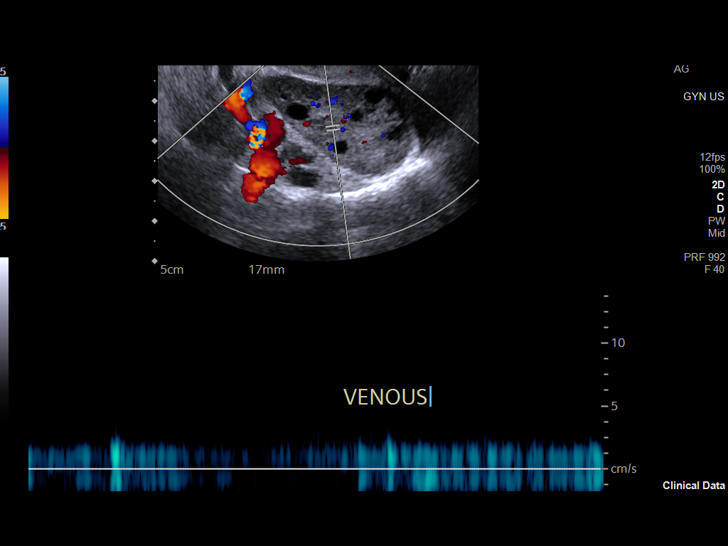
[im 77/93]
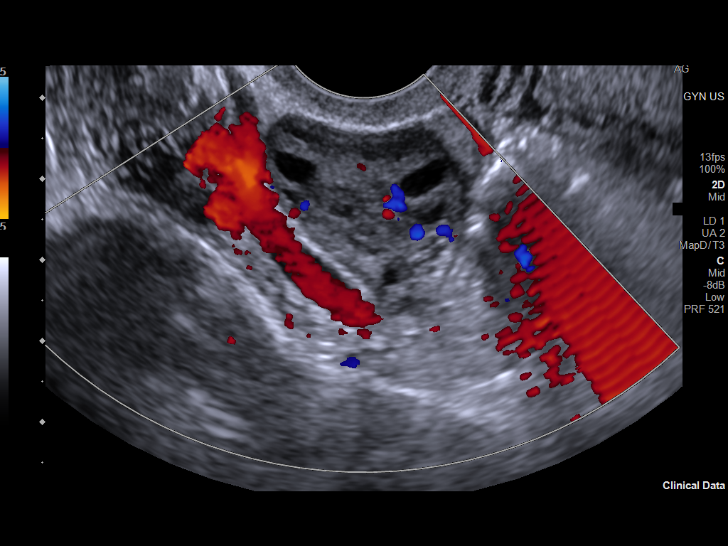
[im 85/93]
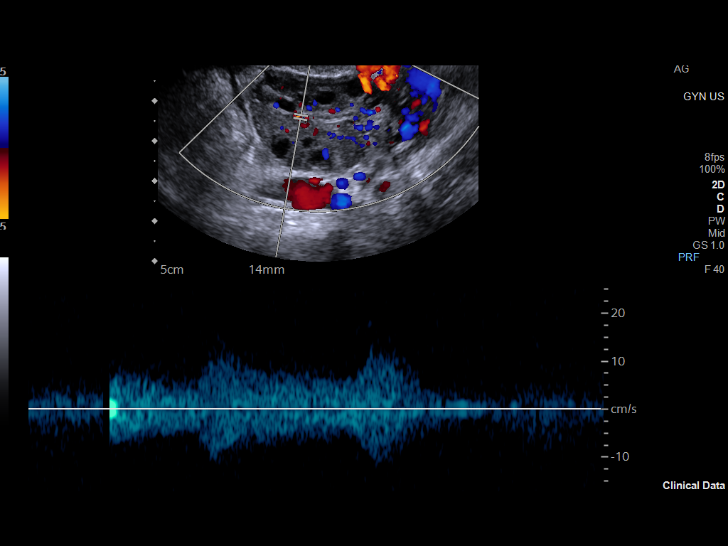
[im 93/93]
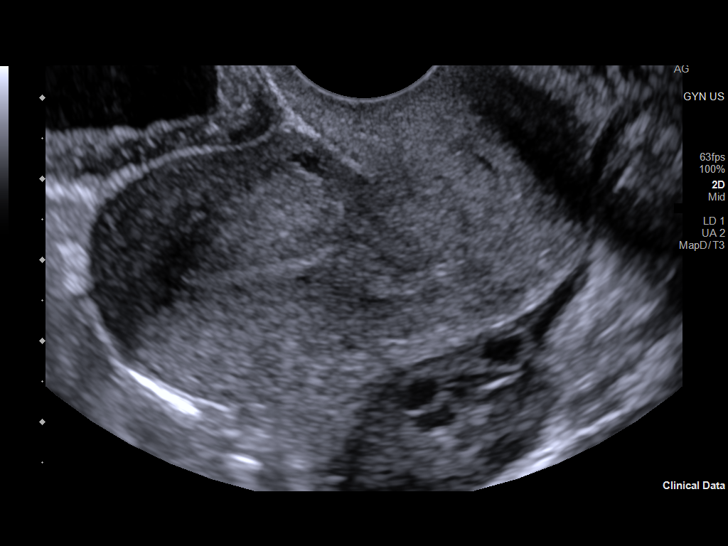

[13 of 25 positions shown; findings below may reference images not displayed]

FINDINGS: Uterus

Measurements: 7.1 x 3.3 x 4.3 cm = volume: 53 mL. No fibroids or
other mass visualized.

Endometrium

Thickness: 3.  No focal abnormality visualized.

Right ovary

Measurements: 4.3 x 2.0 x 2.5 cm = volume: 11 mL. Normal
appearance/no adnexal mass.

Left ovary

Measurements: 3.9 x 2.4 x 2.7 cm = volume: 13 mL. Normal
appearance/no adnexal mass.

Pulsed Doppler evaluation of both ovaries demonstrates normal
low-resistance arterial and venous waveforms.

Other findings

Diffuse mild thickening of the bladder wall.
IMPRESSION: 1. No evidence of ovarian torsion at this time.
2. Normal sonographic appearance of the uterus and ovaries.
3. Diffuse mild thickening of the bladder wall may reflect
underlying cystitis of either infectious or inflammatory etiology.
Recommend correlation with urinalysis.

## 2022-08-18 ENCOUNTER — Observation Stay
Admission: EM | Admit: 2022-08-18 | Discharge: 2022-08-19 | Disposition: A | Discharge status: Home / Self Care | Payer: MEDICAID | Attending: Obstetrics and Gynecology | Admitting: Obstetrics and Gynecology

## 2022-08-18 ENCOUNTER — Encounter: Payer: Self-pay | Admitting: Emergency Medicine

## 2022-08-18 DIAGNOSIS — O99332 Smoking (tobacco) complicating pregnancy, second trimester: Secondary | ICD-10-CM | POA: Diagnosis not present

## 2022-08-18 DIAGNOSIS — O24912 Unspecified diabetes mellitus in pregnancy, second trimester: Secondary | ICD-10-CM | POA: Diagnosis not present

## 2022-08-18 DIAGNOSIS — Z79899 Other long term (current) drug therapy: Secondary | ICD-10-CM | POA: Insufficient documentation

## 2022-08-18 DIAGNOSIS — F17290 Nicotine dependence, other tobacco product, uncomplicated: Secondary | ICD-10-CM | POA: Diagnosis not present

## 2022-08-18 DIAGNOSIS — O4690 Antepartum hemorrhage, unspecified, unspecified trimester: Principal | ICD-10-CM | POA: Diagnosis present

## 2022-08-18 DIAGNOSIS — O468X2 Other antepartum hemorrhage, second trimester: Secondary | ICD-10-CM | POA: Diagnosis present

## 2022-08-18 DIAGNOSIS — Z794 Long term (current) use of insulin: Secondary | ICD-10-CM | POA: Insufficient documentation

## 2022-08-18 DIAGNOSIS — Z3A20 20 weeks gestation of pregnancy: Secondary | ICD-10-CM | POA: Diagnosis not present

## 2022-08-18 DIAGNOSIS — O0992 Supervision of high risk pregnancy, unspecified, second trimester: Secondary | ICD-10-CM | POA: Diagnosis not present

## 2022-08-18 MED ORDER — CALCIUM CARBONATE ANTACID 500 MG PO CHEW: 2.0000 | CHEWABLE_TABLET | ORAL | Status: DC | PRN

## 2022-08-18 MED ORDER — ACETAMINOPHEN 325 MG PO TABS: 650.0000 mg | ORAL_TABLET | ORAL | Status: DC | PRN

## 2022-08-18 MED ORDER — LACTATED RINGERS IV SOLN: 125.0000 mL/h | INTRAVENOUS | Status: DC

## 2022-08-18 NOTE — Progress Notes (Signed)
Pt does not have transportation home. No taxi service available at this hour. Pt is calling sister and another friend to secure ride.
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
## 2022-08-19 DIAGNOSIS — O468X2 Other antepartum hemorrhage, second trimester: Secondary | ICD-10-CM | POA: Diagnosis not present

## 2022-11-05 ENCOUNTER — Encounter: Payer: Self-pay | Admitting: Obstetrics and Gynecology

## 2022-11-05 ENCOUNTER — Inpatient Hospital Stay
Admission: EM | Admit: 2022-11-05 | Discharge: 2022-11-05 | Disposition: A | Discharge status: Other Acute Inpatient Hospital | Payer: MEDICAID | Attending: Certified Nurse Midwife | Admitting: Obstetrics and Gynecology

## 2022-11-05 ENCOUNTER — Other Ambulatory Visit: Payer: Self-pay

## 2022-11-05 DIAGNOSIS — O24113 Pre-existing diabetes mellitus, type 2, in pregnancy, third trimester: Secondary | ICD-10-CM | POA: Diagnosis not present

## 2022-11-05 DIAGNOSIS — O42913 Preterm premature rupture of membranes, unspecified as to length of time between rupture and onset of labor, third trimester: Secondary | ICD-10-CM | POA: Diagnosis present

## 2022-11-05 DIAGNOSIS — O359XX0 Maternal care for (suspected) fetal abnormality and damage, unspecified, not applicable or unspecified: Secondary | ICD-10-CM | POA: Insufficient documentation

## 2022-11-05 DIAGNOSIS — Z3A31 31 weeks gestation of pregnancy: Secondary | ICD-10-CM | POA: Insufficient documentation

## 2022-11-05 DIAGNOSIS — Z794 Long term (current) use of insulin: Secondary | ICD-10-CM | POA: Insufficient documentation

## 2022-11-05 LAB — CHLAMYDIA/NGC RT PCR (ARMC ONLY)
Chlamydia Tr: NOT DETECTED
N gonorrhoeae: NOT DETECTED

## 2022-11-05 LAB — WET PREP, GENITAL
Clue Cells Wet Prep HPF POC: NONE SEEN
Sperm: NONE SEEN
Trich, Wet Prep: NONE SEEN
WBC, Wet Prep HPF POC: >=10 — AB (ref <10)

## 2022-11-05 LAB — RUPTURE OF MEMBRANE (ROM)PLUS: Rom Plus: NEGATIVE

## 2022-11-05 LAB — GLUCOSE, CAPILLARY: Glucose-Capillary: 116 mg/dL — ABNORMAL HIGH (ref 70–99)

## 2022-11-05 MED ORDER — MAGNESIUM SULFATE 40 GM/1000ML IV SOLN
2.0000 g/h | INTRAVENOUS | Status: DC
  Administered 2022-11-05: 2 g/h via INTRAVENOUS
  Filled 2022-11-05: qty 1000

## 2022-11-05 MED ORDER — BETAMETHASONE SOD PHOS & ACET 6 (3-3) MG/ML IJ SUSP
12.0000 mg | Freq: Once | INTRAMUSCULAR | Status: AC
  Administered 2022-11-05: 12 mg via INTRAMUSCULAR
  Filled 2022-11-05: qty 5

## 2022-11-05 MED ORDER — LACTATED RINGERS IV SOLN: 125.0000 mL | INTRAVENOUS | Status: AC

## 2022-11-05 MED ORDER — AZITHROMYCIN 500 MG PO TABS
1000.0000 mg | ORAL_TABLET | Freq: Once | ORAL | Status: AC
  Administered 2022-11-05: 1000 mg via ORAL
  Filled 2022-11-05 (×2): qty 2

## 2022-11-05 MED ORDER — NIFEDIPINE 10 MG PO CAPS
10.0000 mg | ORAL_CAPSULE | Freq: Four times a day (QID) | ORAL | Status: DC
Start: 2022-11-06 — End: 2022-11-06

## 2022-11-05 MED ORDER — MAGNESIUM SULFATE BOLUS VIA INFUSION
6.0000 g | Freq: Once | INTRAVENOUS | Status: AC
  Administered 2022-11-05: 6 g via INTRAVENOUS
  Filled 2022-11-05: qty 1000

## 2022-11-05 MED ORDER — MAGNESIUM SULFATE BOLUS VIA INFUSION
2.0000 g | Freq: Once | INTRAVENOUS | Status: DC
  Filled 2022-11-05: qty 1000

## 2022-11-05 MED ORDER — SODIUM CHLORIDE 0.9 % IV SOLN: 2.0000 g | Freq: Four times a day (QID) | INTRAVENOUS | Status: AC

## 2022-11-05 MED ORDER — MAGNESIUM SULFATE 40 GM/1000ML IV SOLN: 2.0000 g/h | INTRAVENOUS | Status: AC

## 2022-11-05 MED ORDER — AMOXICILLIN 500 MG PO CAPS
500.0000 mg | ORAL_CAPSULE | Freq: Three times a day (TID) | ORAL | Status: DC
  Filled 2022-11-05: qty 1

## 2022-11-05 MED ORDER — LACTATED RINGERS IV SOLN: INTRAVENOUS | Status: DC

## 2022-11-05 MED ORDER — SODIUM CHLORIDE 0.9 % IV SOLN
2.0000 g | Freq: Four times a day (QID) | INTRAVENOUS | Status: DC
  Administered 2022-11-05: 2 g via INTRAVENOUS
  Filled 2022-11-05: qty 2000

## 2022-11-05 MED ORDER — NIFEDIPINE 10 MG PO CAPS: 10.0000 mg | ORAL_CAPSULE | Freq: Four times a day (QID) | ORAL | Status: AC

## 2022-11-05 MED ORDER — NIFEDIPINE 10 MG PO CAPS
30.0000 mg | ORAL_CAPSULE | Freq: Once | ORAL | Status: AC
  Administered 2022-11-05: 30 mg via ORAL
  Filled 2022-11-05: qty 3

## 2022-11-05 NOTE — Discharge Summary (Signed)
Patient ID: Bailey Robinson MRN: [id number] DOB/AGE: [birth year] 21 y.o.  Admit date: 11/05/2022 Discharge date: 11/05/2022  Admission Diagnoses: 21yo G1P0 at 105w3d presents with pPROM and contractions.  Discharge Diagnoses: pPROM  Factors complicating pregnancy: Pre-pregnancy DM Fetal anomalies   Prenatal Procedures: NST  Consults: None  Significant Diagnostic Studies:  Results for orders placed or performed during the hospital encounter of 11/05/22 (from the past 168 hour(s))  Glucose, capillary   Collection Time: 11/05/22  7:16 PM  Result Value Ref Range   Glucose-Capillary 116 (H) 70 - 99 mg/dL  Wet prep, genital   Collection Time: 11/05/22  7:31 PM  Result Value Ref Range   Yeast Wet Prep HPF POC PRESENT (A) NONE SEEN   Trich, Wet Prep NONE SEEN NONE SEEN   Clue Cells Wet Prep HPF POC NONE SEEN NONE SEEN   WBC, Wet Prep HPF POC >=10 (A) <10   Sperm NONE SEEN   Chlamydia/NGC rt PCR ([facility] only)   Collection Time: 11/05/22  7:31 PM  Result Value Ref Range   Specimen source GC/Chlam ENDOCERVICAL    Chlamydia Tr NOT DETECTED NOT DETECTED   N gonorrhoeae NOT DETECTED NOT DETECTED  Rupture of Membrane (ROM) Plus   Collection Time: 11/05/22  7:31 PM  Result Value Ref Range   Rom Plus NEGATIVE     Treatments: IV hydration, antibiotics: Ampicillin, Amoxicillin, and Azithromycin, steroids: BMZ, Magnesium, Procardia  Hospital Course:  This is a 22 y.o. G1P0000 with IUP at [redacted]w[redacted]d seen for pPROM and uterine contractions.  Speculum was positive for pooling and cervix was visualized closed.  UCs noted about q 2-5 mins.Labs collected and noted above.  Magnesium was started for neuro protection, antibiotics was started as well for ROM prophylaxis, and Procardia for tocolysis.  Patient is stable and will be transferred to [facility] for further care.     Discharge Physical Exam:  BP 137/67   Pulse (!) 104   Temp 98.1 F (36.7 C)   Resp 16   LMP 03/30/2022   SpO2 99%   General:  NAD CV: RRR Pulm: nl effort ABD: s/nd/nt, gravid DVT Evaluation: LE non-ttp, no evidence of DVT on exam.  NST: FHR baseline: 120 bpm Variability: moderate Accelerations: yes Decelerations: none Time: 1850 to 2045 Category/reactivity: reactive  TOCO: contractions irregular pattern about 2-6 min SVE: deferred; Speculum exam closed     Discharge Condition: Stable  Disposition:  Discharge disposition: [facility]-Another Health Care Institution Not Defined        Allergies as of 11/05/2022   No Known Allergies      Medication List     TAKE these medications    ampicillin 2 g in sodium chloride 0.9 % 100 mL Inject 2 g into the vein every 6 (six) hours. Start taking on: November 06, 2022   doxycycline 100 MG capsule Commonly known as: VIBRAMYCIN Take 1 capsule (100 mg total) by mouth 2 (two) times daily.   glucagon 1 MG injection Inject 1 mg into the vein once as needed (severe hypoglycemia).   insulin aspart 100 UNIT/ML injection Commonly known as: novoLOG Inject 70 Units into the skin See admin instructions. Inject 10u per 8gms at breakfast, 1u per 15gms at lunch, 1u per 10gms at dinner and 1u per 15gms with snacks - max 70u daily   insulin degludec 100 UNIT/ML FlexTouch Pen Commonly known as: TRESIBA Inject 16 Units into the skin daily.   lactated ringers infusion Inject 125 mLs into the vein continuous.  magnesium sulfate 40 grams in SWI 1000 mL 40 GM/1000ML Soln Inject 2 g/hr into the vein continuous.   NIFEdipine 10 MG capsule Commonly known as: PROCARDIA Take 1 capsule (10 mg total) by mouth every 6 (six) hours. Start taking on: November 06, 2022   ondansetron 4 MG disintegrating tablet Commonly known as: Zofran ODT Take 1 tablet (4 mg total) by mouth every 8 (eight) hours as needed for nausea or vomiting.         SignedHaroldine Laws, CNM 11/05/2022 10:08 PM

## 2022-11-06 LAB — GROUP B STREP BY PCR: Group B strep by PCR: POSITIVE — AB
